# Patient Record
Sex: Male | Born: 1960 | Hispanic: No | Marital: Married | Smoking: Current every day smoker
Health system: Southern US, Community
[De-identification: ages and names within clinical notes are randomized; demographics above are authoritative.]

## PROBLEM LIST (undated history)

## (undated) DIAGNOSIS — K649 Unspecified hemorrhoids: Secondary | ICD-10-CM

## (undated) DIAGNOSIS — E538 Deficiency of other specified B group vitamins: Secondary | ICD-10-CM

## (undated) DIAGNOSIS — I447 Left bundle-branch block, unspecified: Secondary | ICD-10-CM

## (undated) DIAGNOSIS — K219 Gastro-esophageal reflux disease without esophagitis: Secondary | ICD-10-CM

## (undated) DIAGNOSIS — Z86010 Personal history of colonic polyps: Secondary | ICD-10-CM

## (undated) DIAGNOSIS — G5600 Carpal tunnel syndrome, unspecified upper limb: Secondary | ICD-10-CM

## (undated) DIAGNOSIS — N529 Male erectile dysfunction, unspecified: Secondary | ICD-10-CM

## (undated) DIAGNOSIS — J439 Emphysema, unspecified: Secondary | ICD-10-CM

## (undated) DIAGNOSIS — IMO0001 Reserved for inherently not codable concepts without codable children: Secondary | ICD-10-CM

## (undated) DIAGNOSIS — E291 Testicular hypofunction: Secondary | ICD-10-CM

## (undated) DIAGNOSIS — E785 Hyperlipidemia, unspecified: Secondary | ICD-10-CM

## (undated) DIAGNOSIS — Z860101 Personal history of adenomatous and serrated colon polyps: Secondary | ICD-10-CM

## (undated) DIAGNOSIS — I2510 Atherosclerotic heart disease of native coronary artery without angina pectoris: Secondary | ICD-10-CM

## (undated) DIAGNOSIS — F17200 Nicotine dependence, unspecified, uncomplicated: Secondary | ICD-10-CM

## (undated) DIAGNOSIS — I4510 Unspecified right bundle-branch block: Secondary | ICD-10-CM

## (undated) DIAGNOSIS — I10 Essential (primary) hypertension: Secondary | ICD-10-CM

## (undated) HISTORY — PX: TONSILLECTOMY: SUR1361

## (undated) HISTORY — DX: Unspecified right bundle-branch block: I45.10

## (undated) HISTORY — DX: Emphysema, unspecified: J43.9

## (undated) HISTORY — DX: Left bundle-branch block, unspecified: I44.7

## (undated) HISTORY — PX: CARPAL TUNNEL RELEASE: SHX101

## (undated) HISTORY — DX: Personal history of colonic polyps: Z86.010

## (undated) HISTORY — DX: Male erectile dysfunction, unspecified: N52.9

## (undated) HISTORY — DX: Unspecified hemorrhoids: K64.9

## (undated) HISTORY — DX: Personal history of adenomatous and serrated colon polyps: Z86.0101

## (undated) HISTORY — DX: Carpal tunnel syndrome, unspecified upper limb: G56.00

## (undated) HISTORY — DX: Hypomagnesemia: E83.42

## (undated) HISTORY — DX: Atherosclerotic heart disease of native coronary artery without angina pectoris: I25.10

## (undated) HISTORY — DX: Essential (primary) hypertension: I10

## (undated) HISTORY — DX: Deficiency of other specified B group vitamins: E53.8

## (undated) HISTORY — DX: Reserved for inherently not codable concepts without codable children: IMO0001

## (undated) HISTORY — DX: Gastro-esophageal reflux disease without esophagitis: K21.9

## (undated) HISTORY — DX: Hyperlipidemia, unspecified: E78.5

## (undated) HISTORY — DX: Testicular hypofunction: E29.1

## (undated) HISTORY — DX: Nicotine dependence, unspecified, uncomplicated: F17.200

---

## 2000-12-25 ENCOUNTER — Encounter: Payer: Self-pay | Admitting: *Deleted

## 2000-12-25 ENCOUNTER — Encounter: Admission: RE | Admit: 2000-12-25 | Discharge: 2000-12-25 | Payer: Self-pay | Admitting: *Deleted

## 2001-10-29 ENCOUNTER — Encounter: Admission: RE | Admit: 2001-10-29 | Discharge: 2001-10-29 | Payer: Self-pay | Admitting: *Deleted

## 2001-10-29 ENCOUNTER — Encounter: Payer: Self-pay | Admitting: *Deleted

## 2003-08-07 ENCOUNTER — Ambulatory Visit (HOSPITAL_COMMUNITY): Admission: RE | Admit: 2003-08-07 | Discharge: 2003-08-07 | Payer: Self-pay | Admitting: Orthopedic Surgery

## 2003-10-06 ENCOUNTER — Encounter: Admission: RE | Admit: 2003-10-06 | Discharge: 2003-10-06 | Payer: Self-pay | Admitting: *Deleted

## 2005-06-09 HISTORY — PX: OTHER SURGICAL HISTORY: SHX169

## 2005-08-28 ENCOUNTER — Encounter: Admission: RE | Admit: 2005-08-28 | Discharge: 2005-08-28 | Payer: Self-pay | Admitting: *Deleted

## 2006-10-12 ENCOUNTER — Encounter: Admission: RE | Admit: 2006-10-12 | Discharge: 2006-10-12 | Payer: Self-pay | Admitting: *Deleted

## 2007-10-08 ENCOUNTER — Encounter: Admission: RE | Admit: 2007-10-08 | Discharge: 2007-10-08 | Payer: Self-pay | Admitting: *Deleted

## 2011-11-04 ENCOUNTER — Other Ambulatory Visit (HOSPITAL_COMMUNITY): Payer: Self-pay | Admitting: Gastroenterology

## 2011-11-04 DIAGNOSIS — R140 Abdominal distension (gaseous): Secondary | ICD-10-CM

## 2011-11-04 DIAGNOSIS — K3184 Gastroparesis: Secondary | ICD-10-CM

## 2011-11-11 ENCOUNTER — Other Ambulatory Visit (HOSPITAL_COMMUNITY): Payer: Self-pay

## 2011-11-21 ENCOUNTER — Encounter (HOSPITAL_COMMUNITY)
Admission: RE | Admit: 2011-11-21 | Discharge: 2011-11-21 | Disposition: A | Discharge status: Home / Self Care | Payer: 59 | Source: Ambulatory Visit | Attending: Gastroenterology | Admitting: Gastroenterology

## 2011-11-21 DIAGNOSIS — R142 Eructation: Secondary | ICD-10-CM | POA: Insufficient documentation

## 2011-11-21 DIAGNOSIS — K3184 Gastroparesis: Secondary | ICD-10-CM | POA: Insufficient documentation

## 2011-11-21 DIAGNOSIS — R141 Gas pain: Secondary | ICD-10-CM | POA: Insufficient documentation

## 2011-11-21 DIAGNOSIS — R140 Abdominal distension (gaseous): Secondary | ICD-10-CM

## 2011-11-21 MED ORDER — TECHNETIUM TC 99M SULFUR COLLOID
2.0000 | Freq: Once | INTRAVENOUS | Status: AC | PRN
  Administered 2011-11-21: 2 via ORAL

## 2013-03-30 ENCOUNTER — Ambulatory Visit (INDEPENDENT_AMBULATORY_CARE_PROVIDER_SITE_OTHER): Payer: 59 | Admitting: Podiatry

## 2013-03-30 ENCOUNTER — Encounter: Payer: Self-pay | Admitting: Podiatry

## 2013-03-30 ENCOUNTER — Ambulatory Visit (INDEPENDENT_AMBULATORY_CARE_PROVIDER_SITE_OTHER): Payer: 59

## 2013-03-30 VITALS — BP 163/88 | HR 70 | Resp 16 | Ht 72.0 in | Wt 185.0 lb

## 2013-03-30 DIAGNOSIS — M779 Enthesopathy, unspecified: Secondary | ICD-10-CM

## 2013-03-30 DIAGNOSIS — M79671 Pain in right foot: Secondary | ICD-10-CM

## 2013-03-30 DIAGNOSIS — M2021 Hallux rigidus, right foot: Secondary | ICD-10-CM

## 2013-03-30 DIAGNOSIS — M2020 Hallux rigidus, unspecified foot: Secondary | ICD-10-CM

## 2013-03-30 DIAGNOSIS — M79609 Pain in unspecified limb: Secondary | ICD-10-CM

## 2013-03-30 MED ORDER — TRIAMCINOLONE ACETONIDE 10 MG/ML IJ SUSP
5.0000 mg | Freq: Once | INTRAMUSCULAR | Status: AC
  Administered 2013-03-30: 5 mg via INTRA_ARTICULAR

## 2013-03-30 NOTE — Progress Notes (Signed)
N-aches L-dorsal right D-few mos. O-gradual C-swelling, worse A-walking, barefoot T-ibuprofen

## 2013-03-30 NOTE — Progress Notes (Signed)
Subjective:     Patient ID: Robert Buchanan, male   DOB: 1961-05-02, 52 y.o.   MRN: [id number]  Foot Pain   patient presents stating I have pain in the top of my right foot especially around my big toe joint and it seems like now into the rest of my foot. States the joint has bothered him for a while and over the last couple months things have become more tender   Review of Systems  All other systems reviewed and are negative.       Objective:   Physical Exam  Nursing note and vitals reviewed. Constitutional: He appears well-developed and well-nourished.  Cardiovascular: Intact distal pulses.   Musculoskeletal: Normal range of motion.  Neurological: He is alert.  Skin: Skin is warm.   limitation of motion noted first MPJ right with mild crepitus within the joint and pain on the lateral side of the joint surface. 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 Explained hallux limitus and the progressive nature of it to patient. Today I injected the lateral joint 3 mg Kenalog 5 mg Xylocaine Marcaine mixture to reduce inflammation reappoint when symptomatic

## 2013-04-28 ENCOUNTER — Ambulatory Visit: Payer: 59 | Admitting: Podiatry

## 2013-05-09 ENCOUNTER — Encounter: Payer: Self-pay | Admitting: Podiatry

## 2013-05-09 ENCOUNTER — Ambulatory Visit (INDEPENDENT_AMBULATORY_CARE_PROVIDER_SITE_OTHER): Payer: 59 | Admitting: Podiatry

## 2013-05-09 VITALS — BP 135/79 | HR 78 | Resp 16

## 2013-05-09 DIAGNOSIS — M2021 Hallux rigidus, right foot: Secondary | ICD-10-CM

## 2013-05-09 DIAGNOSIS — M2020 Hallux rigidus, unspecified foot: Secondary | ICD-10-CM

## 2013-05-10 NOTE — Progress Notes (Signed)
Subjective:     Patient ID: Robert Buchanan, male   DOB: 12/29/1960, 52 y.o.   MRN: [id number]  HPI patient states that my right foot is feeling better. States that the joint is not hurting at this time   Review of Systems     Objective:   Physical Exam     neurovascular status found to be intact with good range of motion of the first MPJ Assessment:     Patient's doing well with significant discomfort of the first MPJ right after injection     Plan:     Reviewed condition and recommended physical therapy for this and rigid bottom shoes. May require further treatment if symptoms return

## 2014-06-13 ENCOUNTER — Encounter: Payer: Self-pay | Admitting: Podiatry

## 2014-06-13 ENCOUNTER — Ambulatory Visit (INDEPENDENT_AMBULATORY_CARE_PROVIDER_SITE_OTHER): Payer: 59

## 2014-06-13 ENCOUNTER — Ambulatory Visit (INDEPENDENT_AMBULATORY_CARE_PROVIDER_SITE_OTHER): Payer: 59 | Admitting: Podiatry

## 2014-06-13 VITALS — BP 113/87 | HR 78 | Resp 16

## 2014-06-13 DIAGNOSIS — M79671 Pain in right foot: Secondary | ICD-10-CM

## 2014-06-13 DIAGNOSIS — M2021 Hallux rigidus, right foot: Secondary | ICD-10-CM

## 2014-06-13 DIAGNOSIS — M779 Enthesopathy, unspecified: Secondary | ICD-10-CM

## 2014-06-13 MED ORDER — TRIAMCINOLONE ACETONIDE 10 MG/ML IJ SUSP
10.0000 mg | Freq: Once | INTRAMUSCULAR | Status: AC
  Administered 2014-06-13: 10 mg

## 2014-06-13 NOTE — Progress Notes (Signed)
Subjective:     Patient ID: Robert Buchanan, male   DOB: November 20, 1960, 54 y.o.   MRN: [id number]  HPI patient presents stating my big toe joint has really been bothering me my right foot for the last 9 months and I been walking differently and developing pain on the outside of my foot and my right ankle. The joint is not bending and it is continuing to give me trouble   Review of Systems     Objective:   Physical Exam Neurovascular status intact with muscle strength adequate range of motion of the subtalar midtarsal joint adequate with pain in the right subtalar joint upon palpation. 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 Patient wants it fixed and I have recommended a biplanar-type osteotomy in conjunction with possible subchondral bone drilling. I allowed him to read a consent form discussing surgery and reviewing the procedure and all possible complications and alternative treatments. Patient wants procedure after review and signs consent form and is given all preoperative instructions. I also went ahead today and reviewed this may not solve his problem completely and he may have problems with other parts of his foot or the big toe joint which ultimately could require fusion. Dispensed air fracture walker with instructions on usage at this time and scheduled for surgery in the next 4-6 weeks

## 2014-07-10 ENCOUNTER — Telehealth: Payer: Self-pay | Admitting: *Deleted

## 2014-07-10 NOTE — Telephone Encounter (Signed)
"  I'd like to schedule surgery on 07/25/2014."  Have you signed consent forms?  I signed them when I was there last."  Okay, I'll get it scheduled.

## 2014-07-20 ENCOUNTER — Telehealth: Payer: Self-pay | Admitting: Podiatry

## 2014-07-20 NOTE — Telephone Encounter (Signed)
Pt called and is scheduled for surgery but has some questions. Please call him back.

## 2014-07-20 NOTE — Telephone Encounter (Signed)
I returned his call.  "How do you all do in case of bad weather as far as surgery?  Do you cancel or carry on with the surgeries?"  They normally carry on.  If conditions are too bad they will reschedule you.  "But for the most part they carry on?"  That is correct.  "Okay, that's all I need to know."

## 2014-07-25 ENCOUNTER — Encounter: Payer: Self-pay | Admitting: Podiatry

## 2014-07-25 DIAGNOSIS — M2011 Hallux valgus (acquired), right foot: Secondary | ICD-10-CM

## 2014-07-28 NOTE — Progress Notes (Signed)
DOS 07/25/2014 Right Bi-planar Robert Buchanan bunionectomy

## 2014-07-28 NOTE — Progress Notes (Signed)
   Subjective:    Patient ID: Robert Buchanan, male    DOB: [birth year], 54 y.o.   MRN: [id number]  HPI    Review of Systems     Objective:   Physical Exam        Assessment & Plan:

## 2014-07-31 ENCOUNTER — Ambulatory Visit (INDEPENDENT_AMBULATORY_CARE_PROVIDER_SITE_OTHER): Payer: 59

## 2014-07-31 ENCOUNTER — Ambulatory Visit (INDEPENDENT_AMBULATORY_CARE_PROVIDER_SITE_OTHER): Payer: 59 | Admitting: Podiatry

## 2014-07-31 ENCOUNTER — Encounter: Payer: Self-pay | Admitting: Podiatry

## 2014-07-31 VITALS — BP 116/78 | HR 77 | Resp 16

## 2014-07-31 DIAGNOSIS — M2011 Hallux valgus (acquired), right foot: Secondary | ICD-10-CM

## 2014-07-31 DIAGNOSIS — M2021 Hallux rigidus, right foot: Secondary | ICD-10-CM

## 2014-07-31 MED ORDER — HYDROCODONE-IBUPROFEN 5-200 MG PO TABS: 1.0000 | ORAL_TABLET | Freq: Three times a day (TID) | ORAL | Status: DC | PRN

## 2014-07-31 NOTE — Progress Notes (Signed)
Subjective:     Patient ID: Robert Buchanan, male   DOB: [birth year], 54 y.o.   MRN: [id number]  HPI patient states I'm doing real well with minimal discomfort or swelling in my right big toe joint   Review of Systems     Objective:   Physical Exam Neurovascular status intact muscle strength adequate with good range of motion first MPJ approximately 30 dorsiflexion 20 plantar flexion with no crepitus within the joint    Assessment:     Doing well post osteotomy first metatarsal right    Plan:     H&P and x-rays reviewed and today I advised on continued physical therapy anti-inflammatory and I dispensed surgical shoe with instructions on continued immobilization. Reappoint to recheck in 2 weeks or earlier if any issues should occur

## 2014-08-14 ENCOUNTER — Ambulatory Visit: Payer: Self-pay

## 2014-08-14 ENCOUNTER — Ambulatory Visit (INDEPENDENT_AMBULATORY_CARE_PROVIDER_SITE_OTHER): Payer: 59 | Admitting: Podiatry

## 2014-08-14 DIAGNOSIS — M2011 Hallux valgus (acquired), right foot: Secondary | ICD-10-CM

## 2014-08-14 DIAGNOSIS — Z9889 Other specified postprocedural states: Secondary | ICD-10-CM | POA: Diagnosis not present

## 2014-08-14 DIAGNOSIS — M2021 Hallux rigidus, right foot: Secondary | ICD-10-CM

## 2014-08-14 NOTE — Progress Notes (Signed)
Subjective:     Patient ID: Robert Buchanan, male   DOB: March 01, 1961, 54 y.o.   MRN: [id number]  HPI patient states I'm doing really well with my right foot and I have good range of motion and I want to return to soft shoe gear   Review of Systems     Objective:   Physical Exam Neurovascular status intact with excellent range of motion first MPJ and no crepitus within the joint with slight restriction of motion but much improved    Assessment:     Doing well from hallux limitus surgery right with mild restriction of motion but significant improvement from preop    Plan:     Dispensed a BioSkin and hold the toe in a plantarflexed position to stretch the capsule. Patient will be seen back in 4 weeks and was given advice on other exercises to do and gradual return soft shoe gear

## 2014-09-18 ENCOUNTER — Ambulatory Visit (INDEPENDENT_AMBULATORY_CARE_PROVIDER_SITE_OTHER): Payer: 59 | Admitting: Podiatry

## 2014-09-18 ENCOUNTER — Ambulatory Visit (INDEPENDENT_AMBULATORY_CARE_PROVIDER_SITE_OTHER): Payer: 59

## 2014-09-18 VITALS — BP 153/83 | HR 67 | Resp 15

## 2014-09-18 DIAGNOSIS — Z9889 Other specified postprocedural states: Secondary | ICD-10-CM

## 2014-09-18 DIAGNOSIS — M2021 Hallux rigidus, right foot: Secondary | ICD-10-CM

## 2014-09-19 NOTE — Progress Notes (Signed)
Subjective:     Patient ID: Robert Buchanan, male   DOB: 04/27/1961, 54 y.o.   MRN: [id number]  HPI patient states I'm doing well with my right foot with ability to walk without discomfort and minimal swelling   Review of Systems     Objective:   Physical Exam Neurovascular status intact muscle strength adequate with negative Homans sign noted. Incision site right as well coapted and range of motion is good with 30 of dorsiflexion 20 plantar flexion    Assessment:     Doing well post osteotomy first metatarsal right foot with good motion and no crepitus in the joint    Plan:     Reviewed x-rays and advised patient on continued range of motion exercises and walking. Patient will be seen back in 6 weeks for final visit and is encouraged to increase activity

## 2014-10-23 ENCOUNTER — Encounter: Payer: Self-pay | Admitting: Podiatry

## 2014-10-23 ENCOUNTER — Ambulatory Visit (INDEPENDENT_AMBULATORY_CARE_PROVIDER_SITE_OTHER): Payer: 59 | Admitting: Podiatry

## 2014-10-23 ENCOUNTER — Ambulatory Visit (INDEPENDENT_AMBULATORY_CARE_PROVIDER_SITE_OTHER): Payer: 59

## 2014-10-23 VITALS — BP 162/99 | HR 72 | Resp 14

## 2014-10-23 DIAGNOSIS — Z9889 Other specified postprocedural states: Secondary | ICD-10-CM | POA: Diagnosis not present

## 2014-10-23 DIAGNOSIS — R609 Edema, unspecified: Secondary | ICD-10-CM

## 2014-10-23 DIAGNOSIS — M79671 Pain in right foot: Secondary | ICD-10-CM

## 2014-10-23 NOTE — Progress Notes (Signed)
   Subjective:    Patient ID: Robert Buchanan, male    DOB: Nov 09, 1960, 54 y.o.   MRN: [id number]  HPI Comments: DOS 07/25/2014 right [place] bunionectomy     Review of Systems     Objective:   Physical Exam        Assessment & Plan:

## 2014-10-24 NOTE — Progress Notes (Signed)
Subjective:     Patient ID: Robert Buchanan, male   DOB: 07-28-1960, 54 y.o.   MRN: [id number]  HPI patient states I returned to work last week and did okay but I'm getting swelling in my foot and ankle that makes it hard for me to be comfortable   Review of Systems     Objective:   Physical Exam Neurovascular status intact negative Homans sign noted with well-healing surgical site right first metatarsal with good range of motion of 30 dorsiflexion 20 plantar flexion with no crepitus. Patient has edema in his mid foot and ankle region on the right side mostly lateral foot but also moderate on the medial but does not have edema in the actual operative site itself    Assessment:    probable reaction to wearing shoe gear again after having had this procedure    Plan:     At this time I placed him in an Unna boot Ace wrap and advised on elevation and anti-inflammatories. We will keep him off work this week and he is off next week for vacation and will return to me in the next several weeks. I reviewed x-rays with them good

## 2014-11-02 ENCOUNTER — Encounter: Payer: Self-pay | Admitting: Podiatry

## 2014-11-02 ENCOUNTER — Ambulatory Visit (INDEPENDENT_AMBULATORY_CARE_PROVIDER_SITE_OTHER): Payer: 59

## 2014-11-02 ENCOUNTER — Ambulatory Visit (INDEPENDENT_AMBULATORY_CARE_PROVIDER_SITE_OTHER): Payer: 59 | Admitting: Podiatry

## 2014-11-02 VITALS — BP 194/92 | HR 86 | Resp 12

## 2014-11-02 DIAGNOSIS — Z9889 Other specified postprocedural states: Secondary | ICD-10-CM | POA: Diagnosis not present

## 2014-11-02 DIAGNOSIS — R609 Edema, unspecified: Secondary | ICD-10-CM

## 2014-11-02 NOTE — Progress Notes (Signed)
   Subjective:    Patient ID: Robert Buchanan, male    DOB: 1960/10/17, 54 y.o.   MRN: [id number]  HPI Follow up, Post-op, patient's right foot is swelling and giving him pain since removing compression bandages from previous visit   Review of Systems     Objective:   Physical Exam        Assessment & Plan:

## 2014-11-04 NOTE — Progress Notes (Signed)
Subjective:     Patient ID: Robert Buchanan, male   DOB: [birth year], 54 y.o.   MRN: [id number]  HPI patient continues to experience swelling in the lateral side of his foot and ankle with the area we did the surgery doing very well with wound edges well coapted and good range of motion. States he's had no problems with his lower leg or no shortness of breath   Review of Systems     Objective:   Physical Exam Neurovascular status remains intact with negative Homans sign noted and continued +1 pitting edema on the lateral side of the foot that is moderately tender when pressed. The medial side we did the surgery is doing fine with minimal swelling and good range of motion    Assessment:     Continues to experience lateral edema of the foot with no indications of systemic disease currently    Plan:     We're going to try more aggressive compression and I dispensed this to him today and then I discussed with him continued elevation and if symptoms do not improve we will send him to the vein doctor for evaluation. I do believe this will go away eventually on its own but unfortunately may take time to do and again for not achieving momentum we are getting a send him to see if there's anything else that can be done but at this point I do believe compression skin to be his best alternative along with elevation

## 2014-11-13 DIAGNOSIS — M79673 Pain in unspecified foot: Secondary | ICD-10-CM

## 2014-11-20 ENCOUNTER — Ambulatory Visit: Payer: 59 | Admitting: Podiatry

## 2015-02-26 ENCOUNTER — Encounter: Payer: Self-pay | Admitting: Cardiovascular Disease

## 2016-08-04 DIAGNOSIS — M4682 Other specified inflammatory spondylopathies, cervical region: Secondary | ICD-10-CM | POA: Diagnosis not present

## 2016-08-04 DIAGNOSIS — M542 Cervicalgia: Secondary | ICD-10-CM | POA: Diagnosis not present

## 2016-08-13 DIAGNOSIS — M542 Cervicalgia: Secondary | ICD-10-CM | POA: Diagnosis not present

## 2016-09-02 DIAGNOSIS — M4682 Other specified inflammatory spondylopathies, cervical region: Secondary | ICD-10-CM | POA: Diagnosis not present

## 2016-09-16 DIAGNOSIS — M4692 Unspecified inflammatory spondylopathy, cervical region: Secondary | ICD-10-CM | POA: Diagnosis not present

## 2017-02-03 DIAGNOSIS — E785 Hyperlipidemia, unspecified: Secondary | ICD-10-CM | POA: Diagnosis not present

## 2017-02-03 DIAGNOSIS — K219 Gastro-esophageal reflux disease without esophagitis: Secondary | ICD-10-CM | POA: Diagnosis not present

## 2017-02-03 DIAGNOSIS — Z79899 Other long term (current) drug therapy: Secondary | ICD-10-CM | POA: Diagnosis not present

## 2017-02-03 DIAGNOSIS — Z23 Encounter for immunization: Secondary | ICD-10-CM | POA: Diagnosis not present

## 2017-03-30 DIAGNOSIS — M62838 Other muscle spasm: Secondary | ICD-10-CM | POA: Diagnosis not present

## 2017-04-01 DIAGNOSIS — M4682 Other specified inflammatory spondylopathies, cervical region: Secondary | ICD-10-CM | POA: Diagnosis not present

## 2017-04-06 DIAGNOSIS — M4682 Other specified inflammatory spondylopathies, cervical region: Secondary | ICD-10-CM | POA: Diagnosis not present

## 2017-04-07 DIAGNOSIS — I10 Essential (primary) hypertension: Secondary | ICD-10-CM | POA: Diagnosis not present

## 2017-04-08 DIAGNOSIS — M4682 Other specified inflammatory spondylopathies, cervical region: Secondary | ICD-10-CM | POA: Diagnosis not present

## 2017-05-05 DIAGNOSIS — M4682 Other specified inflammatory spondylopathies, cervical region: Secondary | ICD-10-CM | POA: Diagnosis not present

## 2017-06-23 DIAGNOSIS — M5030 Other cervical disc degeneration, unspecified cervical region: Secondary | ICD-10-CM | POA: Diagnosis not present

## 2017-06-23 DIAGNOSIS — M4682 Other specified inflammatory spondylopathies, cervical region: Secondary | ICD-10-CM | POA: Diagnosis not present

## 2017-09-02 DIAGNOSIS — E785 Hyperlipidemia, unspecified: Secondary | ICD-10-CM | POA: Diagnosis not present

## 2017-09-02 DIAGNOSIS — N529 Male erectile dysfunction, unspecified: Secondary | ICD-10-CM | POA: Diagnosis not present

## 2017-09-02 DIAGNOSIS — I10 Essential (primary) hypertension: Secondary | ICD-10-CM | POA: Diagnosis not present

## 2017-09-02 DIAGNOSIS — M79605 Pain in left leg: Secondary | ICD-10-CM | POA: Diagnosis not present

## 2017-09-07 ENCOUNTER — Other Ambulatory Visit: Payer: Self-pay | Admitting: Family Medicine

## 2017-09-07 DIAGNOSIS — M79605 Pain in left leg: Secondary | ICD-10-CM

## 2017-10-01 ENCOUNTER — Ambulatory Visit
Admission: RE | Admit: 2017-10-01 | Discharge: 2017-10-01 | Disposition: A | Discharge status: Home / Self Care | Payer: 59 | Source: Ambulatory Visit | Attending: Family Medicine | Admitting: Family Medicine

## 2017-10-01 DIAGNOSIS — M79605 Pain in left leg: Secondary | ICD-10-CM

## 2017-10-01 DIAGNOSIS — M79662 Pain in left lower leg: Secondary | ICD-10-CM | POA: Diagnosis not present

## 2017-10-06 DIAGNOSIS — E291 Testicular hypofunction: Secondary | ICD-10-CM | POA: Diagnosis not present

## 2017-11-05 DIAGNOSIS — H33102 Unspecified retinoschisis, left eye: Secondary | ICD-10-CM | POA: Diagnosis not present

## 2017-11-05 DIAGNOSIS — H5203 Hypermetropia, bilateral: Secondary | ICD-10-CM | POA: Diagnosis not present

## 2017-11-24 DIAGNOSIS — Z79899 Other long term (current) drug therapy: Secondary | ICD-10-CM | POA: Diagnosis not present

## 2017-11-24 DIAGNOSIS — E291 Testicular hypofunction: Secondary | ICD-10-CM | POA: Diagnosis not present

## 2017-11-24 DIAGNOSIS — Z125 Encounter for screening for malignant neoplasm of prostate: Secondary | ICD-10-CM | POA: Diagnosis not present

## 2018-01-14 DIAGNOSIS — E291 Testicular hypofunction: Secondary | ICD-10-CM | POA: Diagnosis not present

## 2018-02-22 DIAGNOSIS — E785 Hyperlipidemia, unspecified: Secondary | ICD-10-CM | POA: Diagnosis not present

## 2018-02-22 DIAGNOSIS — I10 Essential (primary) hypertension: Secondary | ICD-10-CM | POA: Diagnosis not present

## 2018-02-22 DIAGNOSIS — Z23 Encounter for immunization: Secondary | ICD-10-CM | POA: Diagnosis not present

## 2018-03-09 DIAGNOSIS — H33102 Unspecified retinoschisis, left eye: Secondary | ICD-10-CM | POA: Diagnosis not present

## 2018-05-24 DIAGNOSIS — L84 Corns and callosities: Secondary | ICD-10-CM | POA: Diagnosis not present

## 2018-05-24 DIAGNOSIS — M79672 Pain in left foot: Secondary | ICD-10-CM | POA: Diagnosis not present

## 2018-07-15 DIAGNOSIS — M5030 Other cervical disc degeneration, unspecified cervical region: Secondary | ICD-10-CM | POA: Diagnosis not present

## 2018-08-10 DIAGNOSIS — M5030 Other cervical disc degeneration, unspecified cervical region: Secondary | ICD-10-CM | POA: Diagnosis not present

## 2018-08-23 DIAGNOSIS — E291 Testicular hypofunction: Secondary | ICD-10-CM | POA: Diagnosis not present

## 2018-08-23 DIAGNOSIS — I10 Essential (primary) hypertension: Secondary | ICD-10-CM | POA: Diagnosis not present

## 2018-08-23 DIAGNOSIS — E785 Hyperlipidemia, unspecified: Secondary | ICD-10-CM | POA: Diagnosis not present

## 2021-09-24 ENCOUNTER — Other Ambulatory Visit: Payer: Self-pay | Admitting: Family Medicine

## 2021-09-24 DIAGNOSIS — F17200 Nicotine dependence, unspecified, uncomplicated: Secondary | ICD-10-CM

## 2021-10-31 ENCOUNTER — Ambulatory Visit
Admission: RE | Admit: 2021-10-31 | Discharge: 2021-10-31 | Disposition: A | Discharge status: Home / Self Care | Payer: 59 | Source: Ambulatory Visit | Attending: Family Medicine | Admitting: Family Medicine

## 2021-10-31 DIAGNOSIS — F17200 Nicotine dependence, unspecified, uncomplicated: Secondary | ICD-10-CM

## 2022-04-17 NOTE — Progress Notes (Addendum)
Cardiology Office Note:    Date:  04/22/2022   ID:  Robert Buchanan, DOB Sep 07, 1960, MRN [id number]  PCP:  Harlan Stains, MD   [facility] Providers Cardiologist:  None   Referring MD: Harlan Stains, MD    History of Present Illness:    Robert Buchanan is a 61 y.o. male with a hx of HTN, HLD, RBBB, aortic atherosclerosis, coronary calcifications on CT chest, and tobacco use who was referred by Dr. Dema Severin for further evaluation of coronary artery Ca.  Patient seen by Dr. Dema Severin on 03/27/22. Note reviewed. He had undergone CT chest lung cancer screening on 10/29/21 where he was found to have 3 vessel coronary Ca as well as aortic atherosclerosis prompting referral to cardiology for further management.  Today, the patient overall feels well. No exertional chest pain or significant SOB. Has mild LE edema at the end of the day but improves with leg elevation. No orthopnea, PND, or significant palpitations. He is fairly active and is able to do the exercise bike for 5-10 minutes without issue. He is able to mow the lawn with a push mower for about 30 minutes where he will be a little winded with activity but no chest pressure.  Blood pressure is mainly 110-140s at home.  Did not tolerate the higher dose of olmesartan due to low blood pressure.   Has 35 pack year smoking history. Continues to smoke but is working on quitting.  Past Medical History:  Diagnosis Date   ASCVD (arteriosclerotic cardiovascular disease)    Carpal tunnel syndrome    ED (erectile dysfunction)    Emphysema lung (HCC)    GERD (gastroesophageal reflux disease)    H/O adenomatous polyp of colon    Hemorrhoids    Hyperlipidemia    Hypertension    ECHO 03/14/05 - AV not well visualized but leaflet excursion appeared normal. Essentially normal ECHO   Hypomagnesemia    Incomplete left bundle branch block (LBBB)    Treadmill stress test 09/18/11 - Neg for ischemia. Pt exercising 8.6 MET workload & a peak HR of  168. Rare PVCs during stage 2. No CP or diagnostic ECG     RBBB    Smoking    Testicular hypofunction    Tobacco dependence    Vitamin B 12 deficiency     Past Surgical History:  Procedure Laterality Date   CARPAL TUNNEL RELEASE     thumb joint fusion Bilateral 06/09/2005   TONSILLECTOMY      Current Medications: Current Meds  Medication Sig   atorvastatin (LIPITOR) 80 MG tablet Take 1 tablet (80 mg total) by mouth daily.   hydrocodone-ibuprofen (VICOPROFEN) 5-200 MG per tablet Take 1 tablet by mouth every 8 (eight) hours as needed for pain.   meperidine (DEMEROL) 50 MG tablet Take 50 mg by mouth every 4 (four) hours as needed for severe pain (1 or 2 tablets every 4 - 6 hours prn pain).   olmesartan (BENICAR) 20 MG tablet Take 10 mg by mouth daily.   omeprazole (PRILOSEC) 20 MG capsule Take 20 mg by mouth daily.   promethazine (PHENERGAN) 25 MG tablet Take 25 mg by mouth every 6 (six) hours as needed for nausea or vomiting.   [DISCONTINUED] atorvastatin (LIPITOR) 40 MG tablet Take 40 mg by mouth daily.     Allergies:   Sulfa antibiotics and Tylenol [acetaminophen]   Social History   Socioeconomic History   Marital status: Married    Spouse name: Not on  file   Number of children: Not on file   Years of education: Not on file   Highest education level: Not on file  Occupational History   Not on file  Tobacco Use   Smoking status: Every Day    Years: 30.00    Types: Cigarettes   Smokeless tobacco: Not on file  Substance and Sexual Activity   Alcohol use: Yes    Alcohol/week: 0.0 standard drinks of alcohol    Comment: social   Drug use: Not on file   Sexual activity: Not on file  Other Topics Concern   Not on file  Social History Narrative   Not on file   Social Determinants of Health   Financial Resource Strain: Not on file  Food Insecurity: Not on file  Transportation Needs: Not on file  Physical Activity: Not on file  Stress: Not on file  Social  Connections: Not on file     Family History: The patient's family history includes Arthritis in his mother; Diabetes in his mother; Hypertension in his mother; Other in his father; Suicidality in an other family member.  ROS:   Please see the history of present illness.     All other systems reviewed and are negative.  EKGs/Labs/Other Studies Reviewed:    The following studies were reviewed today: CT Chest 10/2021: EXAM: CT CHEST WITHOUT CONTRAST LOW-DOSE FOR LUNG CANCER SCREENING   TECHNIQUE: Multidetector CT imaging of the chest was performed following the standard protocol without IV contrast.   RADIATION DOSE REDUCTION: This exam was performed according to the departmental dose-optimization program which includes automated exposure control, adjustment of the mA and/or kV according to patient size and/or use of iterative reconstruction technique.   COMPARISON:  None Available.   FINDINGS: Cardiovascular: Aortic atherosclerosis. Normal heart size, without pericardial effusion. Left main and 3 vessel coronary artery calcification.   Mediastinum/Nodes: No mediastinal or definite hilar adenopathy, given limitations of unenhanced CT.   Lungs/Pleura: No pleural fluid. Mild centrilobular emphysema. Minimal motion degradation inferiorly.   No suspicious pulmonary nodule or mass.   Upper Abdomen: Normal imaged portions of the liver, spleen, gallbladder, pancreas, adrenal glands, kidneys. The greater curvature gastric wall appears mildly thickened, but is underdistended. 2.4 cm on 51/2.   Musculoskeletal: No acute osseous abnormality.   IMPRESSION: 1. Lung-RADS 1, negative. Continue annual screening with low-dose chest CT without contrast in 12 months. 2. Aortic Atherosclerosis (ICD10-I70.0) and Emphysema (ICD10-J43.9). Coronary artery atherosclerosis. 3. Apparent gastric wall thickening could be due to underdistention. Correlate with symptoms of gastritis.  EKG:  EKG  is  ordered today.  The ekg ordered today demonstrates NSR, RBBB, PAC  Recent Labs: No results found for requested labs within last 365 days.  Recent Lipid Panel No results found for: "CHOL", "TRIG", "HDL", "CHOLHDL", "VLDL", "LDLCALC", "LDLDIRECT"   Risk Assessment/Calculations:                Physical Exam:    VS:  BP 124/76   Pulse 75   Ht 6' (1.829 m)   Wt 196 lb (88.9 kg)   SpO2 99%   BMI 26.58 kg/m     Wt Readings from Last 3 Encounters:  04/22/22 196 lb (88.9 kg)  03/30/13 185 lb (83.9 kg)     GEN:  Well nourished, well developed in no acute distress HEENT: Normal NECK: No JVD; No carotid bruits CARDIAC: RRR, no murmurs, rubs, gallops RESPIRATORY:  Clear to auscultation without rales, wheezing or rhonchi  ABDOMEN: Soft,  non-tender, non-distended MUSCULOSKELETAL:  No edema; No deformity  SKIN: Warm and dry NEUROLOGIC:  Alert and oriented x 3 PSYCHIATRIC:  Normal affect   ASSESSMENT:    1. Coronary artery disease involving native heart without angina pectoris, unspecified vessel or lesion type   2. Medication management   3. Pure hypercholesterolemia   4. Aortic atherosclerosis ([place])   5. Tobacco use    PLAN:    In order of problems listed above:  #Coronary Artery Ca: Noted on CT chest for lung cancer screening. Currently, asymptomatic and fairly active. Will continue with aggressive medical therapy and encouraged tobacco cessation. -Increase lipitor to 65m daily -Encouraged tobacco cessation -Lifestyle modifications as below  #HTN: BP mainly 110-140s. Did not tolerate the higher dose of olmesartan due to low blood pressure. Will continue close monitoring. -Continue olmesartan 172mdaily  #Aortic atherosclerosis: #HLD: -Increase lipitor to 8049maily -Goal LDL<70; will repeat lipids in 8 weeks (last LDL 91 on lipitor 37m63mily)  #Tobacco Use: -Encourage tobacco cessation  Exercise recommendations: Goal of exercising for at least 30  minutes a day, at least 5 times per week.  Please exercise to a moderate exertion.  This means that while exercising it is difficult to speak in full sentences, however you are not so short of breath that you feel you must stop, and not so comfortable that you can carry on a full conversation.  Exertion level should be approximately a 5/10, if 10 is the most exertion you can perform.  Diet recommendations: Recommend a heart healthy diet such as the Mediterranean diet.  This diet consists of plant based foods, healthy fats, lean meats, olive oil.  It suggests limiting the intake of simple carbohydrates such as white breads, pastries, and pastas.  It also limits the amount of red meat, wine, and dairy products such as cheese that one should consume on a daily basis.             Medication Adjustments/Labs and Tests Ordered: Current medicines are reviewed at length with the patient today.  Concerns regarding medicines are outlined above.  Orders Placed This Encounter  Procedures   Lipid Profile   EKG 12-Lead   Meds ordered this encounter  Medications   atorvastatin (LIPITOR) 80 MG tablet    Sig: Take 1 tablet (80 mg total) by mouth daily.    Dispense:  90 tablet    Refill:  3    Dose increase    Patient Instructions  Medication Instructions:   INCREASE YOUR ATORVASTATIN (LIPITOR) TO 80 MG BY MOUTH DAILY  *If you need a refill on your cardiac medications before your next appointment, please call your pharmacy*   Lab Work:  IN 8 WEEKS HERE IN THE OFFICE--LIPIDS--PLEASE COME FASTING TO THIS LAB APPOINTMENT'  If you have labs (blood work) drawn today and your tests are completely normal, you will receive your results only by: [facility] you have MyChart) OR A paper copy in the mail If you have any lab test that is abnormal or we need to change your treatment, we will call you to review the results.   Follow-Up: [facility] and your [facility] are  our priority.  As part of our continuing mission to provide you with exceptional heart care, we have created designated Provider Care Teams.  These Care Teams include your primary Cardiologist (physician) and Advanced Practice Providers (APPs -  Physician Assistants and Nurse Practitioners) who all work together to provide you  with the care you need, when you need it.  We recommend signing up for the patient portal called "MyChart".  Sign up information is provided on this After Visit Summary.  MyChart is used to connect with patients for Virtual Visits (Telemedicine).  Patients are able to view lab/test results, encounter notes, upcoming appointments, etc.  Non-urgent messages can be sent to your provider as well.   To learn more about what you can do with MyChart, go to [email or web address].    Your next appointment:   6 month(s)  The format for your next appointment:   In Person  Provider:   Robbie Lis, PA-C, Nicholes Rough, PA-C, Melina Copa, PA-C, Ambrose Pancoast, NP, Cecilie Kicks, NP, Ermalinda Barrios, PA-C, Christen Bame, NP, or Richardson Dopp, PA-C          Important Information About Sugar         Signed, Freada Bergeron, MD  04/22/2022 8:46 AM    [place]

## 2022-04-22 ENCOUNTER — Ambulatory Visit: Payer: 59 | Attending: Cardiology | Admitting: Cardiology

## 2022-04-22 ENCOUNTER — Encounter: Payer: Self-pay | Admitting: Cardiology

## 2022-04-22 VITALS — BP 124/76 | HR 75 | Ht 72.0 in | Wt 196.0 lb

## 2022-04-22 DIAGNOSIS — I2510 Atherosclerotic heart disease of native coronary artery without angina pectoris: Secondary | ICD-10-CM

## 2022-04-22 DIAGNOSIS — I700 Atherosclerosis of aorta: Secondary | ICD-10-CM

## 2022-04-22 DIAGNOSIS — Z79899 Other long term (current) drug therapy: Secondary | ICD-10-CM | POA: Diagnosis not present

## 2022-04-22 DIAGNOSIS — Z720 Tobacco use: Secondary | ICD-10-CM

## 2022-04-22 DIAGNOSIS — E7800 Pure hypercholesterolemia, unspecified: Secondary | ICD-10-CM

## 2022-04-22 MED ORDER — ATORVASTATIN CALCIUM 80 MG PO TABS: 80.0000 mg | ORAL_TABLET | Freq: Every day | ORAL | 3 refills | Status: DC

## 2022-04-22 NOTE — Patient Instructions (Signed)
Medication Instructions:   INCREASE YOUR ATORVASTATIN (LIPITOR) TO 80 MG BY MOUTH DAILY  *If you need a refill on your cardiac medications before your next appointment, please call your pharmacy*   Lab Work:  IN 8 WEEKS HERE IN THE OFFICE--LIPIDS--PLEASE COME FASTING TO THIS LAB APPOINTMENT'  If you have labs (blood work) drawn today and your tests are completely normal, you will receive your results only by: MyChart Message (if you have MyChart) OR A paper copy in the mail If you have any lab test that is abnormal or we need to change your treatment, we will call you to review the results.   Follow-Up: At [facility], you and your health needs are our priority.  As part of our continuing mission to provide you with exceptional heart care, we have created designated Provider Care Teams.  These Care Teams include your primary Cardiologist (physician) and Advanced Practice Providers (APPs -  Physician Assistants and Nurse Practitioners) who all work together to provide you with the care you need, when you need it.  We recommend signing up for the patient portal called "MyChart".  Sign up information is provided on this After Visit Summary.  MyChart is used to connect with patients for Virtual Visits (Telemedicine).  Patients are able to view lab/test results, encounter notes, upcoming appointments, etc.  Non-urgent messages can be sent to your provider as well.   To learn more about what you can do with MyChart, go to [email or web address].    Your next appointment:   6 month(s)  The format for your next appointment:   In Person  Provider:   Chelsea Aus, PA-C, Jari Favre, PA-C, Ronie Spies, PA-C, Robin Searing, NP, Nada Boozer, NP, Jacolyn Reedy, PA-C, Eligha Bridegroom, NP, or Tereso Newcomer, PA-C          Important Information About Sugar

## 2022-06-17 ENCOUNTER — Other Ambulatory Visit: Payer: 59

## 2022-06-19 ENCOUNTER — Ambulatory Visit: Payer: BC Managed Care – PPO | Attending: Cardiology

## 2022-06-19 DIAGNOSIS — Z79899 Other long term (current) drug therapy: Secondary | ICD-10-CM

## 2022-06-19 DIAGNOSIS — E7800 Pure hypercholesterolemia, unspecified: Secondary | ICD-10-CM

## 2022-06-19 DIAGNOSIS — I2510 Atherosclerotic heart disease of native coronary artery without angina pectoris: Secondary | ICD-10-CM | POA: Diagnosis not present

## 2022-06-19 LAB — LIPID PANEL
Chol/HDL Ratio: 3 ratio (ref 0.0–5.0)
Cholesterol, Total: 162 mg/dL (ref 100–199)
HDL: 54 mg/dL (ref >39)
LDL Chol Calc (NIH): 89 mg/dL (ref 0–99)
Triglycerides: 104 mg/dL (ref 0–149)
VLDL Cholesterol Cal: 19 mg/dL (ref 5–40)

## 2022-06-20 ENCOUNTER — Telehealth: Payer: Self-pay | Admitting: *Deleted

## 2022-06-20 DIAGNOSIS — Z79899 Other long term (current) drug therapy: Secondary | ICD-10-CM

## 2022-06-20 DIAGNOSIS — I700 Atherosclerosis of aorta: Secondary | ICD-10-CM

## 2022-06-20 DIAGNOSIS — I2510 Atherosclerotic heart disease of native coronary artery without angina pectoris: Secondary | ICD-10-CM

## 2022-06-20 DIAGNOSIS — E7800 Pure hypercholesterolemia, unspecified: Secondary | ICD-10-CM

## 2022-06-20 MED ORDER — EZETIMIBE 10 MG PO TABS: 10.0000 mg | ORAL_TABLET | Freq: Every day | ORAL | 2 refills | Status: DC

## 2022-06-20 NOTE — Telephone Encounter (Signed)
The patient has been notified of the result and verbalized understanding.  All questions (if any) were answered.  Asked the pt if he was taking his lipitor 80 mg po daily and if so, we would add zetia 10 mg po daily to his regimen to take with his lipitor and have him come back in, in 8 weeks to recheck lipids.  Pt states he is taking his lipitor 80 mg po  daily everyday, with no skipped doses.   Pt aware to continue his lipitor 80 mg and start taking zetia 10 mg po daily, and come in for repeat lipids in 8 weeks.   Confirmed the pharmacy of choice with the pt.   Scheduled the pt repeat lipids in 8 weeks on 08/15/22.  He is aware to come fasting to this lab appt.   Pt verbalized understanding and agrees with this plan.

## 2022-06-20 NOTE — Telephone Encounter (Signed)
-----  Message from Freada Bergeron, MD sent at 06/20/2022  1:36 PM EST ----- Cholesterol is above goal. Did he end up increasing his lipitor to 80mg  daily? If so, can we add zetia 10mg  daily and repeat lipids in 8 weeks. If not, would recommend the 80mg  and repeat lipids in 8 weeks. His goal LDL<70.

## 2022-08-15 ENCOUNTER — Ambulatory Visit: Payer: BC Managed Care – PPO | Attending: Cardiology

## 2022-08-15 DIAGNOSIS — I2510 Atherosclerotic heart disease of native coronary artery without angina pectoris: Secondary | ICD-10-CM

## 2022-08-15 DIAGNOSIS — I700 Atherosclerosis of aorta: Secondary | ICD-10-CM

## 2022-08-15 DIAGNOSIS — E7800 Pure hypercholesterolemia, unspecified: Secondary | ICD-10-CM

## 2022-08-15 DIAGNOSIS — Z79899 Other long term (current) drug therapy: Secondary | ICD-10-CM

## 2022-08-16 LAB — LIPID PANEL
Chol/HDL Ratio: 2.5 ratio (ref 0.0–5.0)
Cholesterol, Total: 123 mg/dL (ref 100–199)
HDL: 50 mg/dL (ref >39)
LDL Chol Calc (NIH): 55 mg/dL (ref 0–99)
Triglycerides: 93 mg/dL (ref 0–149)
VLDL Cholesterol Cal: 18 mg/dL (ref 5–40)

## 2022-09-23 DIAGNOSIS — M25512 Pain in left shoulder: Secondary | ICD-10-CM | POA: Diagnosis not present

## 2022-09-25 DIAGNOSIS — I2510 Atherosclerotic heart disease of native coronary artery without angina pectoris: Secondary | ICD-10-CM | POA: Diagnosis not present

## 2022-09-25 DIAGNOSIS — I700 Atherosclerosis of aorta: Secondary | ICD-10-CM | POA: Diagnosis not present

## 2022-09-25 DIAGNOSIS — E785 Hyperlipidemia, unspecified: Secondary | ICD-10-CM | POA: Diagnosis not present

## 2022-09-25 DIAGNOSIS — Z125 Encounter for screening for malignant neoplasm of prostate: Secondary | ICD-10-CM | POA: Diagnosis not present

## 2022-09-25 DIAGNOSIS — I10 Essential (primary) hypertension: Secondary | ICD-10-CM | POA: Diagnosis not present

## 2022-09-26 ENCOUNTER — Other Ambulatory Visit: Payer: Self-pay | Admitting: Family Medicine

## 2022-09-26 DIAGNOSIS — F17200 Nicotine dependence, unspecified, uncomplicated: Secondary | ICD-10-CM

## 2022-10-02 DIAGNOSIS — Z720 Tobacco use: Secondary | ICD-10-CM | POA: Diagnosis not present

## 2022-11-04 ENCOUNTER — Encounter: Payer: Self-pay | Admitting: Family Medicine

## 2022-11-05 ENCOUNTER — Ambulatory Visit
Admission: RE | Admit: 2022-11-05 | Discharge: 2022-11-05 | Disposition: A | Discharge status: Home / Self Care | Payer: BC Managed Care – PPO | Source: Ambulatory Visit | Attending: Family Medicine | Admitting: Family Medicine

## 2022-11-05 DIAGNOSIS — F17200 Nicotine dependence, unspecified, uncomplicated: Secondary | ICD-10-CM

## 2022-11-05 DIAGNOSIS — Z87891 Personal history of nicotine dependence: Secondary | ICD-10-CM | POA: Diagnosis not present

## 2022-11-25 ENCOUNTER — Ambulatory Visit: Payer: BC Managed Care – PPO | Admitting: Physician Assistant

## 2022-11-25 DIAGNOSIS — H2513 Age-related nuclear cataract, bilateral: Secondary | ICD-10-CM | POA: Diagnosis not present

## 2022-11-25 NOTE — Progress Notes (Unsigned)
  Cardiology Office Note:  .   Date:  11/25/2022  ID:  Robert Buchanan, DOB [birth year], MRN [id number] PCP: Laurann Montana, MD  [facility] Providers Cardiologist:  None {  History of Present Illness: .   Robert Buchanan is a 62 y.o. male with a past medical history of hypertension, hyperlipidemia, RBBB, aortic atherosclerosis, coronary calcifications on CT scan, and tobacco use originally referred for further evaluation of CAD here for follow-up.  Patient was seen by Dr. Cliffton Asters 03/27/2022.  And undergone CT of the chest for lung cancer screening on 10/29/2021 where he was found to have three-vessel CAD as well as aortic atherosclerosis prompting referral to cardiology for further management.  Seen with Dr. Shari Prows 04/22/2022 and was not having any chest pain or SOB.  Mild LE edema (dependent).  No orthopnea, PND or significant palpitations.  Fairly active and able to exercise on the bike 5 to 10 minutes without issue.  Able to mow the lawn with a push mower about 30 minutes.  Blood pressure well-controlled did not tolerate the higher dose of olmesartan due to low blood pressure.  Has 35-pack-year smoking history and continues to smoke but working on quitting.  Today, he***  ROS: ***  Studies Reviewed: .        *** Risk Assessment/Calculations:   {Does this patient have ATRIAL FIBRILLATION?:[phone]} No BP recorded.  {Refresh Note OR Click here to enter BP  :1}***       Physical Exam:   VS:  There were no vitals taken for this visit.   Wt Readings from Last 3 Encounters:  04/22/22 196 lb (88.9 kg)  03/30/13 185 lb (83.9 kg)    GEN: Well nourished, well developed in no acute distress NECK: No JVD; No carotid bruits CARDIAC: ***RRR, no murmurs, rubs, gallops RESPIRATORY:  Clear to auscultation without rales, wheezing or rhonchi  ABDOMEN: Soft, non-tender, non-distended EXTREMITIES:  No edema; No deformity   ASSESSMENT AND PLAN: .   1.  CAD/aortic atherosclerosis 2.   Hyperlipidemia 3.  Hypertension 4.  Tobacco use     {Are you ordering a CV Procedure (e.g. stress test, cath, DCCV, TEE, etc)?   Press F2        :[id number]}  Dispo: ***  Signed, Sharlene Dory, PA-C

## 2022-11-26 ENCOUNTER — Encounter: Payer: Self-pay | Admitting: Physician Assistant

## 2022-11-26 ENCOUNTER — Ambulatory Visit: Payer: BC Managed Care – PPO | Attending: Physician Assistant | Admitting: Physician Assistant

## 2022-11-26 VITALS — BP 132/70 | HR 65 | Ht 72.0 in | Wt 193.2 lb

## 2022-11-26 DIAGNOSIS — I10 Essential (primary) hypertension: Secondary | ICD-10-CM | POA: Diagnosis not present

## 2022-11-26 DIAGNOSIS — I2510 Atherosclerotic heart disease of native coronary artery without angina pectoris: Secondary | ICD-10-CM

## 2022-11-26 DIAGNOSIS — Z79899 Other long term (current) drug therapy: Secondary | ICD-10-CM

## 2022-11-26 DIAGNOSIS — Z720 Tobacco use: Secondary | ICD-10-CM

## 2022-11-26 DIAGNOSIS — E785 Hyperlipidemia, unspecified: Secondary | ICD-10-CM | POA: Diagnosis not present

## 2022-11-26 DIAGNOSIS — I700 Atherosclerosis of aorta: Secondary | ICD-10-CM

## 2022-11-26 NOTE — Patient Instructions (Signed)
Medication Instructions:  Your physician recommends that you continue on your current medications as directed. Please refer to the Current Medication list given to you today.  *If you need a refill on your cardiac medications before your next appointment, please call your pharmacy*  Lab Work: None ordered If you have labs (blood work) drawn today and your tests are completely normal, you will receive your results only by: MyChart Message (if you have MyChart) OR A paper copy in the mail If you have any lab test that is abnormal or we need to change your treatment, we will call you to review the results.  Follow-Up: At [facility], you and your health needs are our priority.  As part of our continuing mission to provide you with exceptional heart care, we have created designated Provider Care Teams.  These Care Teams include your primary Cardiologist (physician) and Advanced Practice Providers (APPs -  Physician Assistants and Nurse Practitioners) who all work together to provide you with the care you need, when you need it.  Your next appointment:   6 month(s) Call and schedule once you decide who you would prefer to see.  Other Instructions Have Dr Cliffton Asters reorder Mag  Low-Sodium Eating Plan Salt (sodium) helps you keep a healthy balance of fluids in your body. Too much sodium can raise your blood pressure. It can also cause fluid and waste to be held in your body. Your health care provider or dietitian may recommend a low-sodium eating plan if you have high blood pressure (hypertension), kidney disease, liver disease, or heart failure. Eating less sodium can help lower your blood pressure and reduce swelling. It can also protect your heart, liver, and kidneys. What are tips for following this plan? Reading food labels  Check food labels for the amount of sodium per serving. If you eat more than one serving, you must multiply the listed amount by the number of servings. Choose  foods with less than 140 milligrams (mg) of sodium per serving. Avoid foods with 300 mg of sodium or more per serving. Always check how much sodium is in a product, even if the label says "unsalted" or "no salt added." Shopping  Buy products labeled as "low-sodium" or "no salt added." Buy fresh foods. Avoid canned foods and pre-made or frozen meals. Avoid canned, cured, or processed meats. Buy breads that have less than 80 mg of sodium per slice. Cooking  Eat more home-cooked food. Try to eat less restaurant, buffet, and fast food. Try not to add salt when you cook. Use salt-free seasonings or herbs instead of table salt or sea salt. Check with your provider or pharmacist before using salt substitutes. Cook with plant-based oils, such as canola, sunflower, or olive oil. Meal planning When eating at a restaurant, ask if your food can be made with less salt or no salt. Avoid dishes labeled as brined, pickled, cured, or smoked. Avoid dishes made with soy sauce, miso, or teriyaki sauce. Avoid foods that have monosodium glutamate (MSG) in them. MSG may be added to some restaurant food, sauces, soups, bouillon, and canned foods. Make meals that can be grilled, baked, poached, roasted, or steamed. These are often made with less sodium. General information Try to limit your sodium intake to 1,500-2,300 mg each day, or the amount told by your provider. What foods should I eat? Fruits Fresh, frozen, or canned fruit. Fruit juice. Vegetables Fresh or frozen vegetables. "No salt added" canned vegetables. "No salt added" tomato sauce and paste.  Low-sodium or reduced-sodium tomato and vegetable juice. Grains Low-sodium cereals, such as oats, puffed wheat and rice, and shredded wheat. Low-sodium crackers. Unsalted rice. Unsalted pasta. Low-sodium bread. Whole grain breads and whole grain pasta. Meats and other proteins Fresh or frozen meat, poultry, seafood, and fish. These should have no added salt.  Low-sodium canned tuna and salmon. Unsalted nuts. Dried peas, beans, and lentils without added salt. Unsalted canned beans. Eggs. Unsalted nut butters. Dairy Milk. Soy milk. Cheese that is naturally low in sodium, such as ricotta cheese, fresh mozzarella, or Swiss cheese. Low-sodium or reduced-sodium cheese. Cream cheese. Yogurt. Seasonings and condiments Fresh and dried herbs and spices. Salt-free seasonings. Low-sodium mustard and ketchup. Sodium-free salad dressing. Sodium-free light mayonnaise. Fresh or refrigerated horseradish. Lemon juice. Vinegar. Other foods Homemade, reduced-sodium, or low-sodium soups. Unsalted popcorn and pretzels. Low-salt or salt-free chips. The items listed above may not be all the foods and drinks you can have. Talk to a dietitian to learn more. What foods should I avoid? Vegetables Sauerkraut, pickled vegetables, and relishes. Olives. [place] fries. Onion rings. Regular canned vegetables, except low-sodium or reduced-sodium items. Regular canned tomato sauce and paste. Regular tomato and vegetable juice. Frozen vegetables in sauces. Grains Instant hot cereals. Bread stuffing, pancake, and biscuit mixes. Croutons. Seasoned rice or pasta mixes. Noodle soup cups. Boxed or frozen macaroni and cheese. Regular salted crackers. Self-rising flour. Meats and other proteins Meat or fish that is salted, canned, smoked, spiced, or pickled. Precooked or cured meat, such as sausages or meat loaves. Tomasa Blase. Ham. Pepperoni. Hot dogs. Corned beef. Chipped beef. Salt pork. Jerky. Pickled herring, anchovies, and sardines. Regular canned tuna. Salted nuts. Dairy Processed cheese and cheese spreads. Hard cheeses. Cheese curds. Blue cheese. Feta cheese. String cheese. Regular cottage cheese. Buttermilk. Canned milk. Fats and oils Salted butter. Regular margarine. Ghee. Bacon fat. Seasonings and condiments Onion salt, garlic salt, seasoned salt, table salt, and sea salt. Canned and  packaged gravies. Worcestershire sauce. Tartar sauce. Barbecue sauce. Teriyaki sauce. Soy sauce, including reduced-sodium soy sauce. Steak sauce. Fish sauce. Oyster sauce. Cocktail sauce. Horseradish that you find on the shelf. Regular ketchup and mustard. Meat flavorings and tenderizers. Bouillon cubes. Hot sauce. Pre-made or packaged marinades. Pre-made or packaged taco seasonings. Relishes. Regular salad dressings. Salsa. Other foods Salted popcorn and pretzels. Corn chips and puffs. Potato and tortilla chips. Canned or dried soups. Pizza. Frozen entrees and pot pies. The items listed above may not be all the foods and drinks you should avoid. Talk to a dietitian to learn more. This information is not intended to replace advice given to you by your health care provider. Make sure you discuss any questions you have with your health care provider. Document Revised: 06/12/2022 Document Reviewed: 06/12/2022 Elsevier Patient Education  2024 [employer] Inc[employer]   Heart-Healthy Eating Plan Many factors influence your heart health, including eating and exercise habits. Heart health is also called coronary health. Coronary risk increases with abnormal blood fat (lipid) levels. A heart-healthy eating plan includes limiting unhealthy fats, increasing healthy fats, limiting salt (sodium) intake, and making other diet and lifestyle changes. What is my plan? Your health care provider may recommend that: You limit your fat intake to _________% or less of your total calories each day. You limit your saturated fat intake to _________% or less of your total calories each day. You limit the amount of cholesterol in your diet to less than _________ mg per day. You limit the amount of sodium in your diet to  less than _________ mg per day. What are tips for following this plan? Cooking Cook foods using methods other than frying. Baking, boiling, grilling, and broiling are all good options. Other ways to reduce fat  include: Removing the skin from poultry. Removing all visible fats from meats. Steaming vegetables in water or broth. Meal planning  At meals, imagine dividing your plate into fourths: Fill one-half of your plate with vegetables and green salads. Fill one-fourth of your plate with whole grains. Fill one-fourth of your plate with lean protein foods. Eat 2-4 cups of vegetables per day. One cup of vegetables equals 1 cup (91 g) broccoli or cauliflower florets, 2 medium carrots, 1 large bell pepper, 1 large sweet potato, 1 large tomato, 1 medium white potato, 2 cups (150 g) raw leafy greens. Eat 1-2 cups of fruit per day. One cup of fruit equals 1 small apple, 1 large banana, 1 cup (237 g) mixed fruit, 1 large orange,  cup (82 g) dried fruit, 1 cup (240 mL) 100% fruit juice. Eat more foods that contain soluble fiber. Examples include apples, broccoli, carrots, beans, peas, and barley. Aim to get 25-30 g of fiber per day. Increase your consumption of legumes, nuts, and seeds to 4-5 servings per week. One serving of dried beans or legumes equals  cup (90 g) cooked, 1 serving of nuts is  oz (12 almonds, 24 pistachios, or 7 walnut halves), and 1 serving of seeds equals  oz (8 g). Fats Choose healthy fats more often. Choose monounsaturated and polyunsaturated fats, such as olive and canola oils, avocado oil, flaxseeds, walnuts, almonds, and seeds. Eat more omega-3 fats. Choose salmon, mackerel, sardines, tuna, flaxseed oil, and ground flaxseeds. Aim to eat fish at least 2 times each week. Check food labels carefully to identify foods with trans fats or high amounts of saturated fat. Limit saturated fats. These are found in animal products, such as meats, butter, and cream. Plant sources of saturated fats include palm oil, palm kernel oil, and coconut oil. Avoid foods with partially hydrogenated oils in them. These contain trans fats. Examples are stick margarine, some tub margarines, cookies,  crackers, and other baked goods. Avoid fried foods. General information Eat more home-cooked food and less restaurant, buffet, and fast food. Limit or avoid alcohol. Limit foods that are high in added sugar and simple starches such as foods made using white refined flour (white breads, pastries, sweets). Lose weight if you are overweight. Losing just 5-10% of your body weight can help your overall health and prevent diseases such as diabetes and heart disease. Monitor your sodium intake, especially if you have high blood pressure. Talk with your health care provider about your sodium intake. Try to incorporate more vegetarian meals weekly. What foods should I eat? Fruits All fresh, canned (in natural juice), or frozen fruits. Vegetables Fresh or frozen vegetables (raw, steamed, roasted, or grilled). Green salads. Grains Most grains. Choose whole wheat and whole grains most of the time. Rice and pasta, including brown rice and pastas made with whole wheat. Meats and other proteins Lean, well-trimmed beef, veal, pork, and lamb. Chicken and [place] without skin. All fish and shellfish. Wild duck, rabbit, pheasant, and venison. Egg whites or low-cholesterol egg substitutes. Dried beans, peas, lentils, and tofu. Seeds and most nuts. Dairy Low-fat or nonfat cheeses, including ricotta and mozzarella. Skim or 1% milk (liquid, powdered, or evaporated). Buttermilk made with low-fat milk. Nonfat or low-fat yogurt. Fats and oils Non-hydrogenated (trans-free) margarines. Vegetable oils, including soybean,  sesame, sunflower, olive, avocado, peanut, safflower, corn, canola, and cottonseed. Salad dressings or mayonnaise made with a vegetable oil. Beverages Water (mineral or sparkling). Coffee and tea. Unsweetened ice tea. Diet beverages. Sweets and desserts Sherbet, gelatin, and fruit ice. Small amounts of dark chocolate. Limit all sweets and desserts. Seasonings and condiments All seasonings and  condiments. The items listed above may not be a complete list of foods and beverages you can eat. Contact a dietitian for more options. What foods should I avoid? Fruits Canned fruit in heavy syrup. Fruit in cream or butter sauce. Fried fruit. Limit coconut. Vegetables Vegetables cooked in cheese, cream, or butter sauce. Fried vegetables. Grains Breads made with saturated or trans fats, oils, or whole milk. Croissants. Sweet rolls. Donuts. High-fat crackers, such as cheese crackers and chips. Meats and other proteins Fatty meats, such as hot dogs, ribs, sausage, bacon, rib-eye roast or steak. High-fat deli meats, such as salami and bologna. Caviar. Domestic duck and goose. Organ meats, such as liver. Dairy Cream, sour cream, cream cheese, and creamed cottage cheese. Whole-milk cheeses. Whole or 2% milk (liquid, evaporated, or condensed). Whole buttermilk. Cream sauce or high-fat cheese sauce. Whole-milk yogurt. Fats and oils Meat fat, or shortening. Cocoa butter, hydrogenated oils, palm oil, coconut oil, palm kernel oil. Solid fats and shortenings, including bacon fat, salt pork, lard, and butter. Nondairy cream substitutes. Salad dressings with cheese or sour cream. Beverages Regular sodas and any drinks with added sugar. Sweets and desserts Frosting. Pudding. Cookies. Cakes. Pies. Milk chocolate or white chocolate. Buttered syrups. Full-fat ice cream or ice cream drinks. The items listed above may not be a complete list of foods and beverages to avoid. Contact a dietitian for more information. Summary Heart-healthy meal planning includes limiting unhealthy fats, increasing healthy fats, limiting salt (sodium) intake and making other diet and lifestyle changes. Lose weight if you are overweight. Losing just 5-10% of your body weight can help your overall health and prevent diseases such as diabetes and heart disease. Focus on eating a balance of foods, including fruits and vegetables, low-fat  or nonfat dairy, lean protein, nuts and legumes, whole grains, and heart-healthy oils and fats. This information is not intended to replace advice given to you by your health care provider. Make sure you discuss any questions you have with your health care provider. Document Revised: 07/01/2021 Document Reviewed: 07/01/2021 Elsevier Patient Education  2024 [employer].

## 2022-12-11 IMAGING — CT CT CHEST LUNG CANCER SCREENING LOW DOSE W/O CM
1 series · 15 of 30 positions shown, 19 images · non-contrast
Comparison: None Available.

CLINICAL DATA: Thirty pack-year smoking history/current smoker



[Series 2: ldct screening <30 bmi · axial · 0.77mm/px · z∈[-160,+145]mm · 15 of 67 slices shown, 19 images]
[im 3/67  mediastinal]
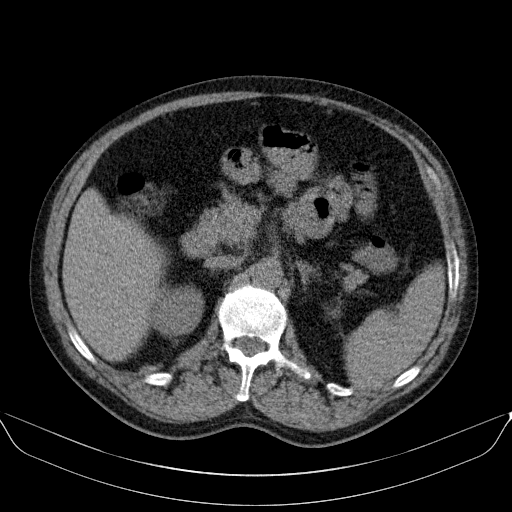
[im 3/67  lung]
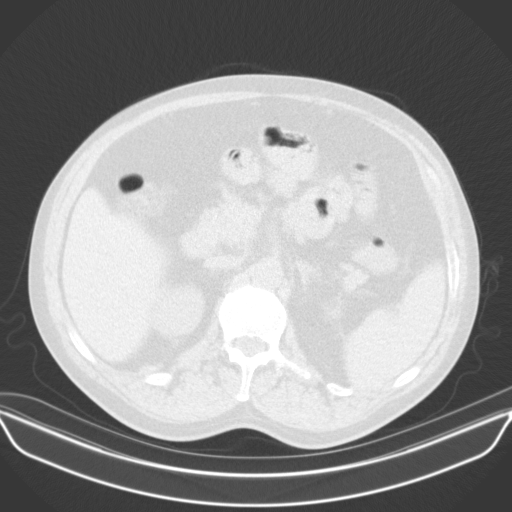
[im 8/67  lung]
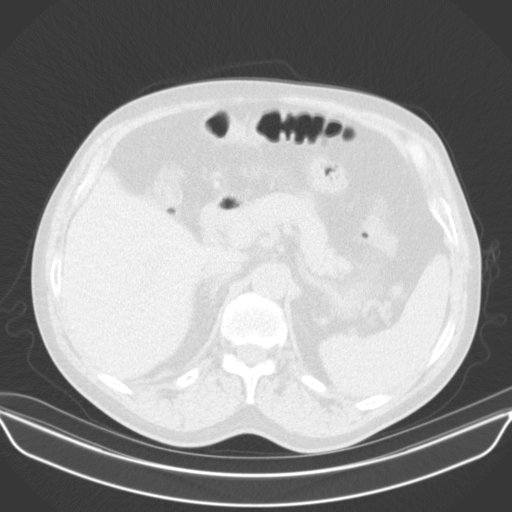
[im 13/67  lung]
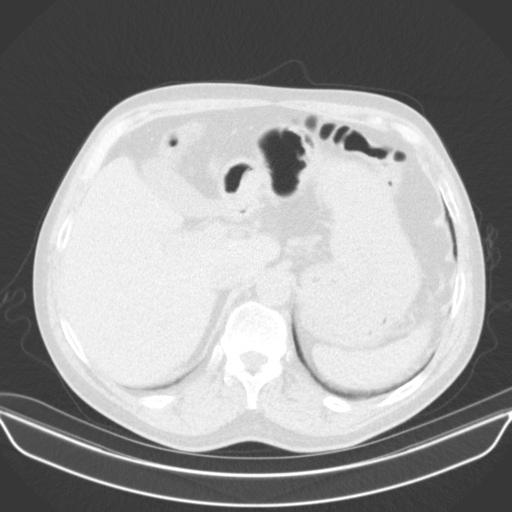
[im 15/67  lung]
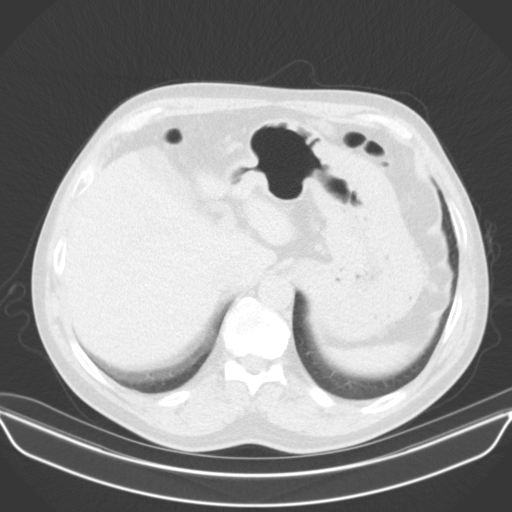
[im 20/67  mediastinal]
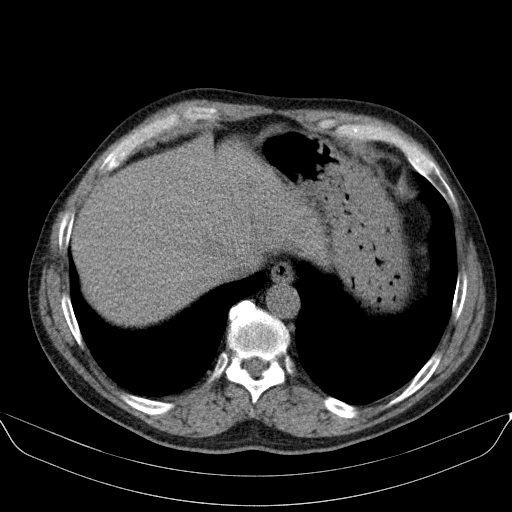
[im 20/67  lung]
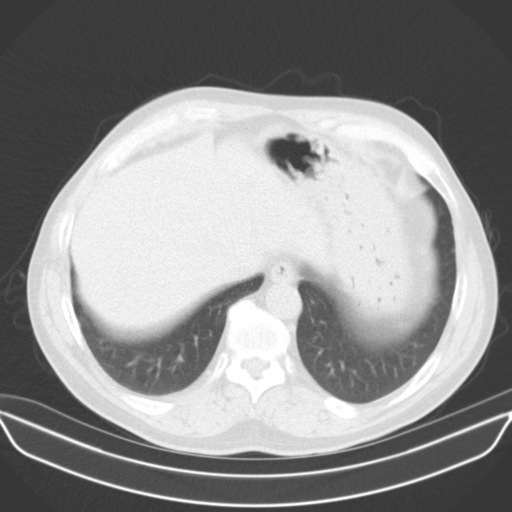
[im 25/67  lung]
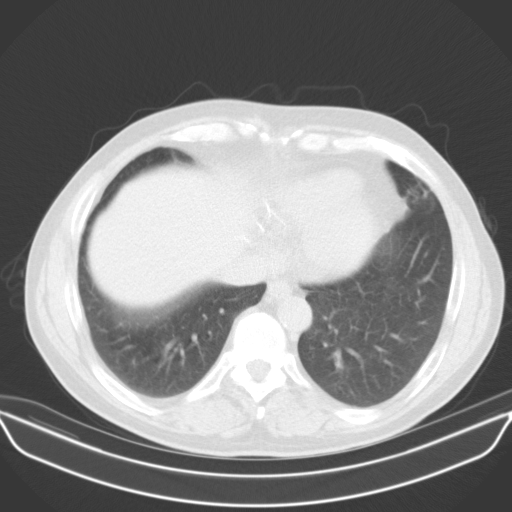
[im 30/67  lung]
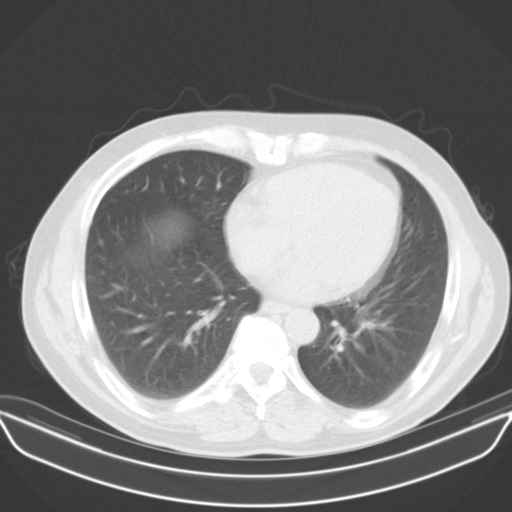
[im 35/67  lung]
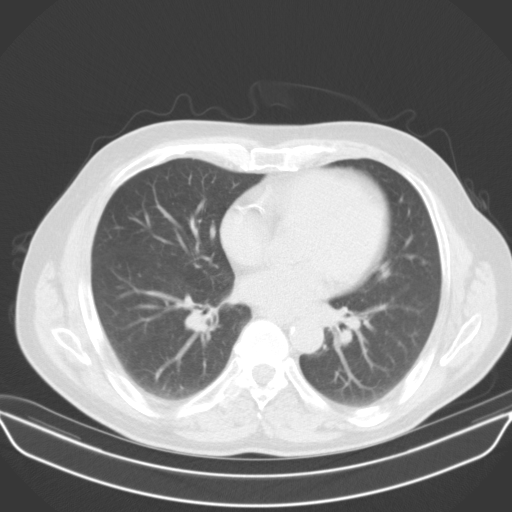
[im 37/67  mediastinal]
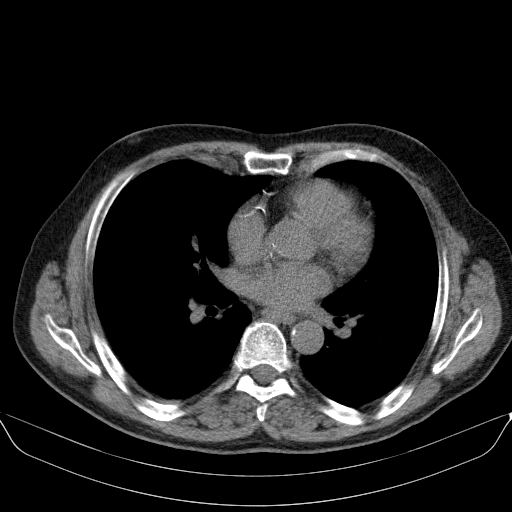
[im 37/67  lung]
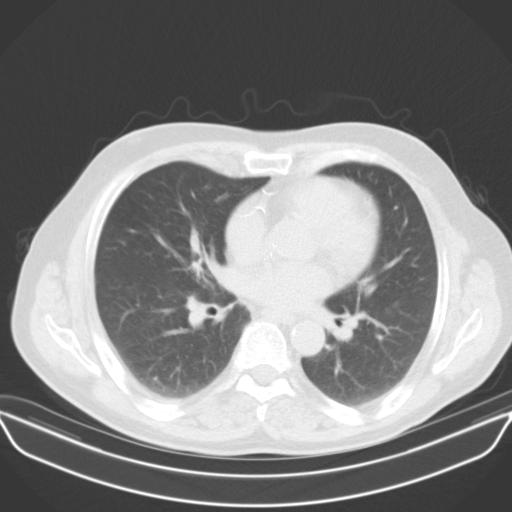
[im 42/67  lung]
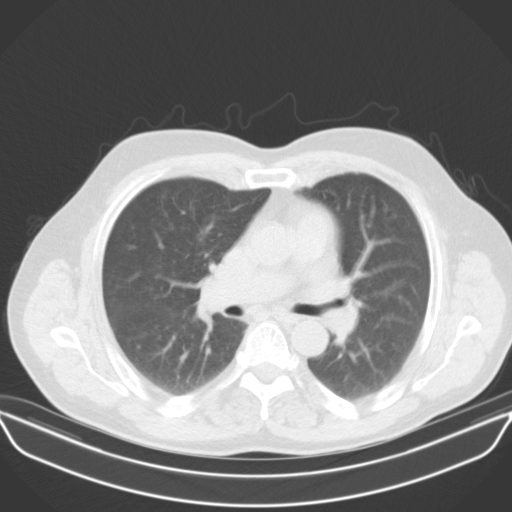
[im 47/67  lung]
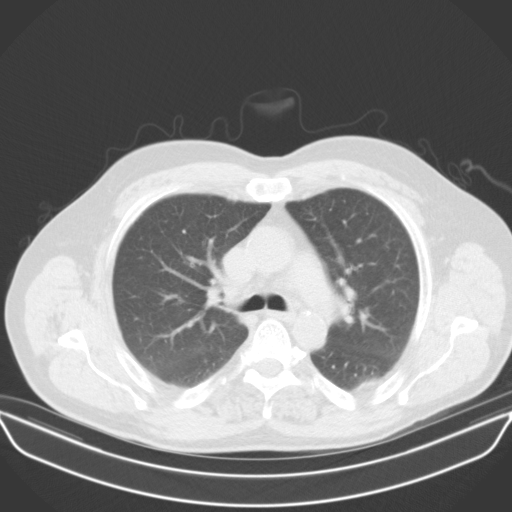
[im 52/67  lung]
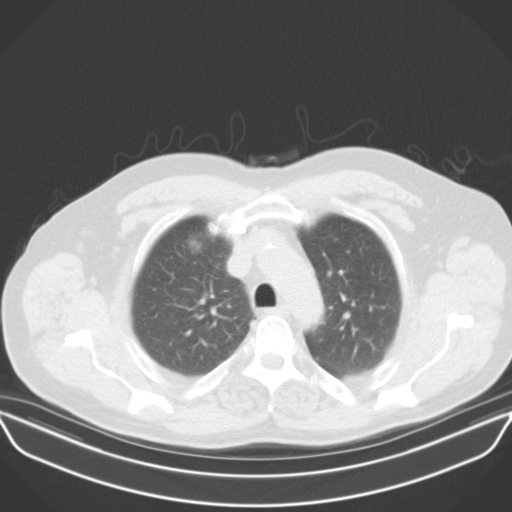
[im 54/67  mediastinal]
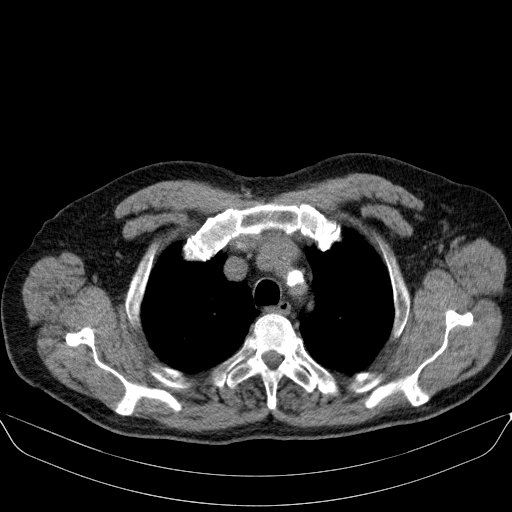
[im 54/67  lung]
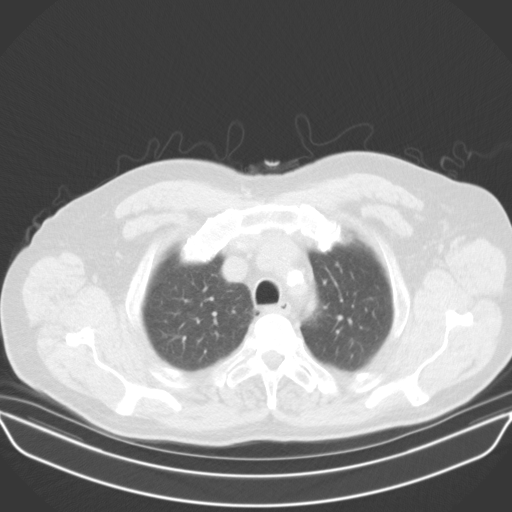
[im 59/67  lung]
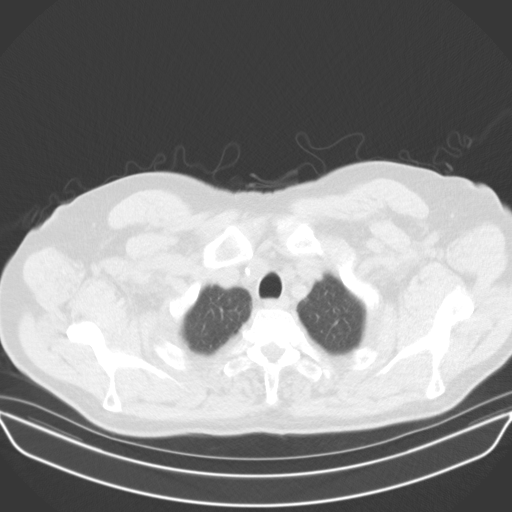
[im 64/67  lung]
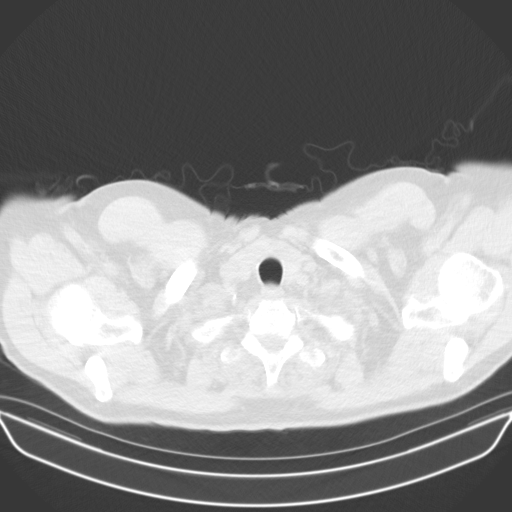

[15 of 30 positions shown; findings below may reference images not displayed]

FINDINGS: Cardiovascular: Aortic atherosclerosis. Normal heart size, without
pericardial effusion. Left main and 3 vessel coronary artery
calcification.

Mediastinum/Nodes: No mediastinal or definite hilar adenopathy,
given limitations of unenhanced CT.

Lungs/Pleura: No pleural fluid. Mild centrilobular emphysema.
Minimal motion degradation inferiorly.

No suspicious pulmonary nodule or mass.

Upper Abdomen: Normal imaged portions of the liver, spleen,
gallbladder, pancreas, adrenal glands, kidneys. The greater
curvature gastric wall appears mildly thickened, but is
underdistended. 2.4 cm on 51/2.

Musculoskeletal: No acute osseous abnormality.
IMPRESSION: 1. Lung-RADS 1, negative. Continue annual screening with low-dose
chest CT without contrast in 12 months.
2. Aortic Atherosclerosis (ST5EV-3W8.8) and Emphysema (ST5EV-79A.4).
Coronary artery atherosclerosis.
3. Apparent gastric wall thickening could be due to underdistention.
Correlate with symptoms of gastritis.

## 2023-01-15 ENCOUNTER — Other Ambulatory Visit: Payer: Self-pay

## 2023-01-15 DIAGNOSIS — E7800 Pure hypercholesterolemia, unspecified: Secondary | ICD-10-CM

## 2023-01-15 DIAGNOSIS — I700 Atherosclerosis of aorta: Secondary | ICD-10-CM

## 2023-01-15 DIAGNOSIS — I2510 Atherosclerotic heart disease of native coronary artery without angina pectoris: Secondary | ICD-10-CM

## 2023-01-15 DIAGNOSIS — Z79899 Other long term (current) drug therapy: Secondary | ICD-10-CM

## 2023-01-15 MED ORDER — EZETIMIBE 10 MG PO TABS
10.0000 mg | ORAL_TABLET | Freq: Every day | ORAL | 3 refills | Status: DC
Start: 2023-01-15 — End: 2024-02-19

## 2023-01-15 MED ORDER — ATORVASTATIN CALCIUM 80 MG PO TABS
80.0000 mg | ORAL_TABLET | Freq: Every day | ORAL | 3 refills | Status: AC
Start: 2023-01-15 — End: ?

## 2023-01-15 NOTE — Addendum Note (Signed)
Addended by: Margaret Pyle D on: 01/15/2023 10:33 AM   Modules accepted: Orders

## 2023-03-16 DIAGNOSIS — M25512 Pain in left shoulder: Secondary | ICD-10-CM | POA: Diagnosis not present

## 2023-03-30 DIAGNOSIS — I700 Atherosclerosis of aorta: Secondary | ICD-10-CM | POA: Diagnosis not present

## 2023-03-30 DIAGNOSIS — I2510 Atherosclerotic heart disease of native coronary artery without angina pectoris: Secondary | ICD-10-CM | POA: Diagnosis not present

## 2023-03-30 DIAGNOSIS — I10 Essential (primary) hypertension: Secondary | ICD-10-CM | POA: Diagnosis not present

## 2023-03-30 DIAGNOSIS — E785 Hyperlipidemia, unspecified: Secondary | ICD-10-CM | POA: Diagnosis not present

## 2023-03-30 DIAGNOSIS — R7301 Impaired fasting glucose: Secondary | ICD-10-CM | POA: Diagnosis not present

## 2023-03-30 DIAGNOSIS — Z23 Encounter for immunization: Secondary | ICD-10-CM | POA: Diagnosis not present

## 2023-04-28 DIAGNOSIS — X32XXXA Exposure to sunlight, initial encounter: Secondary | ICD-10-CM | POA: Diagnosis not present

## 2023-04-28 DIAGNOSIS — B078 Other viral warts: Secondary | ICD-10-CM | POA: Diagnosis not present

## 2023-04-28 DIAGNOSIS — L570 Actinic keratosis: Secondary | ICD-10-CM | POA: Diagnosis not present

## 2023-04-28 DIAGNOSIS — L821 Other seborrheic keratosis: Secondary | ICD-10-CM | POA: Diagnosis not present

## 2023-05-11 ENCOUNTER — Ambulatory Visit: Payer: BC Managed Care – PPO | Attending: Cardiology | Admitting: Cardiology

## 2023-05-11 ENCOUNTER — Encounter: Payer: Self-pay | Admitting: Cardiology

## 2023-05-11 VITALS — BP 138/64 | HR 77 | Ht 72.0 in | Wt 189.0 lb

## 2023-05-11 DIAGNOSIS — E785 Hyperlipidemia, unspecified: Secondary | ICD-10-CM | POA: Diagnosis not present

## 2023-05-11 DIAGNOSIS — Z720 Tobacco use: Secondary | ICD-10-CM

## 2023-05-11 DIAGNOSIS — I2510 Atherosclerotic heart disease of native coronary artery without angina pectoris: Secondary | ICD-10-CM

## 2023-05-11 DIAGNOSIS — I10 Essential (primary) hypertension: Secondary | ICD-10-CM

## 2023-05-11 NOTE — Patient Instructions (Signed)
Medication Instructions:  The current medical regimen is effective;  continue present plan and medications.  *If you need a refill on your cardiac medications before your next appointment, please call your pharmacy*   Follow-Up: At [facility], you and your health needs are our priority.  As part of our continuing mission to provide you with exceptional heart care, we have created designated Provider Care Teams.  These Care Teams include your primary Cardiologist (physician) and Advanced Practice Providers (APPs -  Physician Assistants and Nurse Practitioners) who all work together to provide you with the care you need, when you need it.  We recommend signing up for the patient portal called "MyChart".  Sign up information is provided on this After Visit Summary.  MyChart is used to connect with patients for Virtual Visits (Telemedicine).  Patients are able to view lab/test results, encounter notes, upcoming appointments, etc.  Non-urgent messages can be sent to your provider as well.   To learn more about what you can do with MyChart, go to [email or web address].    Your next appointment:   2 year(s)  Provider:   Dr Candee Furbish

## 2023-05-11 NOTE — Progress Notes (Signed)
Cardiology Office Note:  .   Date:  05/11/2023  ID:  Robert Buchanan, DOB 1960/08/17, MRN [id number] PCP: Laurann Montana, MD  [facility] Providers Cardiologist:  Donato Schultz, MD     History of Present Illness: .   Robert Buchanan is a 62 y.o. male Discussed with the use of AI scribe software  History of Present Illness   The 62 year old patient with a history of right bundle branch block, hypertension, hyperlipidemia, aortic atherosclerosis, and coronary calcifications was referred to cardiology following a lung cancer screening CT in May 2023. The CT revealed three-vessel coronary disease and aortic atherosclerosis. The patient has been on atorvastatin 80mg  and Zetia 10mg  for lipid management, with an LDL of 55 as of March 2024. He is also on olmesartan 10mg  daily for blood pressure control.  The patient reports episodes of lightheadedness and fatigue with exertion, such as walking during golf games. These symptoms have been occurring for years but seem to be increasing in frequency. The patient initially attributed these symptoms to dehydration but noted that they persist despite adequate hydration. He also reports sweating with minimal effort. The patient has been monitoring his blood pressure at home and has noted readings as low as 108/60.  The patient is a current smoker but is actively working on quitting and has started using nicotine patches. He has no reported chest pain but mentions a sensation of pressure in the upper chest, which he attributes to muscular strain.          Studies Reviewed: Marland Kitchen   EKG Interpretation Date/Time:  Monday May 11 2023 14:06:21 EST Ventricular Rate:  87 PR Interval:  132 QRS Duration:  124 QT Interval:  364 QTC Calculation: 438 R Axis:   144  Text Interpretation: Normal sinus rhythm Right bundle branch block No previous ECGs available Confirmed by Donato Schultz ([id number]) on 05/11/2023 2:13:16 PM    Results LABS LDL: 55 mg/dL  (06/11/7251)  RADIOLOGY Lung cancer screening CT: Three-vessel coronary artery disease, aortic atherosclerosis (10/2021)  Risk Assessment/Calculations:            Physical Exam:   VS:  BP 138/64   Pulse 77   Ht 6' (1.829 m)   Wt 189 lb (85.7 kg)   SpO2 99%   BMI 25.63 kg/m    Wt Readings from Last 3 Encounters:  05/11/23 189 lb (85.7 kg)  11/26/22 193 lb 3.2 oz (87.6 kg)  04/22/22 196 lb (88.9 kg)    GEN: Well nourished, well developed in no acute distress NECK: No JVD; No carotid bruits CARDIAC: RRR, no murmurs, no rubs, no gallops RESPIRATORY:  Clear to auscultation without rales, wheezing or rhonchi  ABDOMEN: Soft, non-tender, non-distended EXTREMITIES:  No edema; No deformity   ASSESSMENT AND PLAN: .    Assessment and Plan    Three Vessel Coronary Artery Disease Diagnosed during lung cancer screening CT in May 2023. Managed with atorvastatin 80 mg and Zetia 10 mg. LDL levels are well controlled at 55 mg/dL as of August 15, 2022. No current symptoms of chest pain. Emphasized maintaining LDL levels below 70 mg/dL to reduce cardiovascular risk. Combination of atorvastatin and Zetia has been effective. - Continue atorvastatin 80 mg daily - Continue Zetia 10 mg daily - Follow-up in two years unless symptoms change  Hyperlipidemia Well controlled with current regimen. LDL at 55 mg/dL. Maintaining LDL below 70 mg/dL significantly reduces cardiovascular risk. - Continue current lipid management with atorvastatin 80 mg and Zetia 10  mg - Repeat lipid panel in April 2024 with primary care physician  Aortic Atherosclerosis Identified during lung cancer screening CT in May 2023. No current symptoms. Emphasized importance of lipid-lowering therapy in managing aortic atherosclerosis and reducing cardiovascular risk. - Continue current management with lipid-lowering therapy  Hypertension Managed with olmesartan 10 mg daily. Home BP readings sometimes as low as 108/60 mmHg, with  occasional dizziness and lightheadedness during exertion. Office BP today is 138/64 mmHg. Discussed risks of low BP, including dizziness and fainting, especially during physical activity. Suggested monitoring BP at home and considering withholding olmesartan on days of physical exertion. - Monitor blood pressure at home and record readings - Consider withholding olmesartan on days of physical exertion and taking it in the evening - Message with home BP readings to determine if olmesartan can be discontinued  Tobacco Use Actively working on quitting smoking using nicotine patches. Discussed benefits of smoking cessation, including reduced cardiovascular risk and improved overall health. - Continue using nicotine patches - Encourage smoking cessation efforts  General Health Maintenance Discussed importance of routine screenings and preventive health measures. - Follow-up with primary care physician in April 2024 for routine blood work and lipid panel  Follow-up - Schedule follow-up appointment in two years - Message with home BP readings to determine if olmesartan can be discontinued.               Signed, Donato Schultz, MD

## 2023-05-25 DIAGNOSIS — M25512 Pain in left shoulder: Secondary | ICD-10-CM | POA: Diagnosis not present

## 2023-06-01 DIAGNOSIS — M25512 Pain in left shoulder: Secondary | ICD-10-CM | POA: Diagnosis not present

## 2023-09-30 ENCOUNTER — Other Ambulatory Visit: Payer: Self-pay | Admitting: Family Medicine

## 2023-09-30 DIAGNOSIS — J439 Emphysema, unspecified: Secondary | ICD-10-CM

## 2023-09-30 DIAGNOSIS — I700 Atherosclerosis of aorta: Secondary | ICD-10-CM | POA: Diagnosis not present

## 2023-09-30 DIAGNOSIS — I10 Essential (primary) hypertension: Secondary | ICD-10-CM | POA: Diagnosis not present

## 2023-09-30 DIAGNOSIS — F17210 Nicotine dependence, cigarettes, uncomplicated: Secondary | ICD-10-CM | POA: Diagnosis not present

## 2023-09-30 DIAGNOSIS — Z125 Encounter for screening for malignant neoplasm of prostate: Secondary | ICD-10-CM | POA: Diagnosis not present

## 2023-09-30 DIAGNOSIS — E785 Hyperlipidemia, unspecified: Secondary | ICD-10-CM | POA: Diagnosis not present

## 2023-09-30 DIAGNOSIS — E538 Deficiency of other specified B group vitamins: Secondary | ICD-10-CM | POA: Diagnosis not present

## 2023-09-30 DIAGNOSIS — R609 Edema, unspecified: Secondary | ICD-10-CM | POA: Diagnosis not present

## 2023-11-12 ENCOUNTER — Encounter: Payer: Self-pay | Admitting: Family Medicine

## 2023-11-13 ENCOUNTER — Encounter: Payer: Self-pay | Admitting: Family Medicine

## 2023-11-17 ENCOUNTER — Ambulatory Visit
Admission: RE | Admit: 2023-11-17 | Discharge: 2023-11-17 | Disposition: A | Discharge status: Home / Self Care | Source: Ambulatory Visit | Attending: Family Medicine | Admitting: Family Medicine

## 2023-11-17 DIAGNOSIS — F17210 Nicotine dependence, cigarettes, uncomplicated: Secondary | ICD-10-CM | POA: Diagnosis not present

## 2023-11-17 DIAGNOSIS — Z122 Encounter for screening for malignant neoplasm of respiratory organs: Secondary | ICD-10-CM | POA: Diagnosis not present

## 2023-11-17 DIAGNOSIS — J439 Emphysema, unspecified: Secondary | ICD-10-CM

## 2023-11-30 DIAGNOSIS — H524 Presbyopia: Secondary | ICD-10-CM | POA: Diagnosis not present

## 2024-01-18 DIAGNOSIS — K080 Exfoliation of teeth due to systemic causes: Secondary | ICD-10-CM | POA: Diagnosis not present

## 2024-02-17 ENCOUNTER — Other Ambulatory Visit: Payer: Self-pay | Admitting: Physician Assistant

## 2024-02-17 DIAGNOSIS — E7800 Pure hypercholesterolemia, unspecified: Secondary | ICD-10-CM

## 2024-02-17 DIAGNOSIS — I2510 Atherosclerotic heart disease of native coronary artery without angina pectoris: Secondary | ICD-10-CM

## 2024-02-17 DIAGNOSIS — I700 Atherosclerosis of aorta: Secondary | ICD-10-CM

## 2024-02-17 DIAGNOSIS — Z79899 Other long term (current) drug therapy: Secondary | ICD-10-CM

## 2024-04-07 DIAGNOSIS — E538 Deficiency of other specified B group vitamins: Secondary | ICD-10-CM | POA: Diagnosis not present

## 2024-04-07 DIAGNOSIS — R7303 Prediabetes: Secondary | ICD-10-CM | POA: Diagnosis not present

## 2024-04-07 DIAGNOSIS — E291 Testicular hypofunction: Secondary | ICD-10-CM | POA: Diagnosis not present

## 2024-04-08 DIAGNOSIS — I700 Atherosclerosis of aorta: Secondary | ICD-10-CM | POA: Diagnosis not present

## 2024-04-08 DIAGNOSIS — I10 Essential (primary) hypertension: Secondary | ICD-10-CM | POA: Diagnosis not present

## 2024-04-08 DIAGNOSIS — I2510 Atherosclerotic heart disease of native coronary artery without angina pectoris: Secondary | ICD-10-CM | POA: Diagnosis not present

## 2024-04-08 DIAGNOSIS — Z0000 Encounter for general adult medical examination without abnormal findings: Secondary | ICD-10-CM | POA: Diagnosis not present

## 2024-04-08 DIAGNOSIS — Z23 Encounter for immunization: Secondary | ICD-10-CM | POA: Diagnosis not present

## 2024-04-08 DIAGNOSIS — F17210 Nicotine dependence, cigarettes, uncomplicated: Secondary | ICD-10-CM | POA: Diagnosis not present

## 2024-04-08 DIAGNOSIS — E785 Hyperlipidemia, unspecified: Secondary | ICD-10-CM | POA: Diagnosis not present

## 2024-04-11 DIAGNOSIS — M25512 Pain in left shoulder: Secondary | ICD-10-CM | POA: Diagnosis not present

## 2024-06-06 ENCOUNTER — Encounter: Payer: Self-pay | Admitting: Cardiology

## 2024-06-06 DIAGNOSIS — Z79899 Other long term (current) drug therapy: Secondary | ICD-10-CM

## 2024-06-06 DIAGNOSIS — I2510 Atherosclerotic heart disease of native coronary artery without angina pectoris: Secondary | ICD-10-CM

## 2024-06-06 DIAGNOSIS — I700 Atherosclerosis of aorta: Secondary | ICD-10-CM

## 2024-06-06 DIAGNOSIS — E7800 Pure hypercholesterolemia, unspecified: Secondary | ICD-10-CM

## 2024-06-06 MED ORDER — EZETIMIBE 10 MG PO TABS: 10.0000 mg | ORAL_TABLET | Freq: Every day | ORAL | 3 refills | Status: AC
